# Patient Record
Sex: Male | Born: 2000 | Race: White | Hispanic: No
Health system: Southern US, Academic
[De-identification: ages and names within clinical notes are randomized; demographics above are authoritative.]

## PROBLEM LIST (undated history)

## (undated) ENCOUNTER — Ambulatory Visit: Payer: MEDICAID | Attending: Pediatrics | Primary: Pediatrics

## (undated) ENCOUNTER — Encounter

## (undated) ENCOUNTER — Telehealth: Attending: Pediatric Gastroenterology | Primary: Pediatric Gastroenterology

## (undated) ENCOUNTER — Telehealth

## (undated) ENCOUNTER — Ambulatory Visit

## (undated) ENCOUNTER — Ambulatory Visit: Payer: PRIVATE HEALTH INSURANCE

## (undated) ENCOUNTER — Ambulatory Visit: Payer: MEDICAID

## (undated) ENCOUNTER — Encounter: Attending: Pediatrics | Primary: Pediatrics

## (undated) ENCOUNTER — Telehealth: Payer: MEDICAID

## (undated) ENCOUNTER — Telehealth
Attending: Student in an Organized Health Care Education/Training Program | Primary: Student in an Organized Health Care Education/Training Program

## (undated) ENCOUNTER — Encounter: Attending: Family | Primary: Family

## (undated) ENCOUNTER — Ambulatory Visit: Payer: MEDICAID | Attending: Registered" | Primary: Registered"

## (undated) ENCOUNTER — Ambulatory Visit: Payer: MEDICAID | Attending: Clinical | Primary: Clinical

## (undated) ENCOUNTER — Ambulatory Visit: Payer: PRIVATE HEALTH INSURANCE | Attending: Family | Primary: Family

## (undated) ENCOUNTER — Ambulatory Visit: Payer: Medicaid (Managed Care)

## (undated) ENCOUNTER — Encounter: Attending: Podiatrist | Primary: Podiatrist

## (undated) ENCOUNTER — Telehealth: Attending: Pediatrics | Primary: Pediatrics

## (undated) ENCOUNTER — Ambulatory Visit
Attending: Student in an Organized Health Care Education/Training Program | Primary: Student in an Organized Health Care Education/Training Program

## (undated) ENCOUNTER — Ambulatory Visit
Payer: PRIVATE HEALTH INSURANCE | Attending: Student in an Organized Health Care Education/Training Program | Primary: Student in an Organized Health Care Education/Training Program

## (undated) ENCOUNTER — Inpatient Hospital Stay

## (undated) ENCOUNTER — Encounter
Attending: Student in an Organized Health Care Education/Training Program | Primary: Student in an Organized Health Care Education/Training Program

## (undated) ENCOUNTER — Telehealth: Attending: Clinical | Primary: Clinical

## (undated) ENCOUNTER — Other Ambulatory Visit

## (undated) ENCOUNTER — Encounter: Attending: Registered" | Primary: Registered"

## (undated) ENCOUNTER — Ambulatory Visit: Payer: Medicaid (Managed Care) | Attending: Podiatrist | Primary: Podiatrist

## (undated) ENCOUNTER — Ambulatory Visit: Payer: MEDICAID | Attending: Family | Primary: Family

## (undated) ENCOUNTER — Ambulatory Visit: Payer: PRIVATE HEALTH INSURANCE | Attending: Clinical | Primary: Clinical

## (undated) DIAGNOSIS — F32A Depression, unspecified: Secondary | ICD-10-CM

## (undated) DIAGNOSIS — F329 Major depressive disorder, single episode, unspecified: Secondary | ICD-10-CM

## (undated) DIAGNOSIS — S060X9A Concussion with loss of consciousness of unspecified duration, initial encounter: Secondary | ICD-10-CM

## (undated) DIAGNOSIS — F909 Attention-deficit hyperactivity disorder, unspecified type: Secondary | ICD-10-CM

## (undated) DIAGNOSIS — K5090 Crohn's disease, unspecified, without complications: Secondary | ICD-10-CM

## (undated) DIAGNOSIS — S060XAA Concussion with loss of consciousness status unknown, initial encounter: Secondary | ICD-10-CM

## (undated) HISTORY — PX: NO PAST SURGERIES: SHX2092

## (undated) HISTORY — PX: APPENDECTOMY: SHX54

## (undated) HISTORY — PX: CHOLECYSTECTOMY: SHX55

---
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
## 2022-01-19 ENCOUNTER — Ambulatory Visit
Admission: EM | Admit: 2022-01-19 | Discharge: 2022-01-19 | Disposition: A | Discharge status: Home / Self Care | Payer: Medicaid Other | Attending: Emergency Medicine | Admitting: Emergency Medicine

## 2022-01-19 DIAGNOSIS — M79645 Pain in left finger(s): Secondary | ICD-10-CM | POA: Diagnosis not present

## 2022-01-19 MED ORDER — PREDNISONE 20 MG PO TABS: 40.0000 mg | ORAL_TABLET | Freq: Every day | ORAL | 0 refills | Status: AC

## 2022-01-19 NOTE — Discharge Instructions (Addendum)
Your pain is most likely result of a tendon injury which we cannot see on x-ray and if you are pain persist you will need higher level imaging  Take prednisone every morning with food for 5 days, this medicine helps to reduce inflammation that occurs with injury and ideally will relieve you of your discomfort, may take Tylenol while using medicine  A wrist brace with thumb has been applied by nursing staff, you may wear as needed to add stability and support  You may use ice over the affected area or heat in 10 to 15-minute intervals  If your symptoms continue to persist she will need follow-up with orthopedics which is the specialist, information is listed on front page

## 2022-01-19 NOTE — ED Triage Notes (Signed)
Pt c/o injury to left thumb x7months ago.  Pt states that he was trying to catch a basketball and it hit his thumb and caused a popping noise. Pt now has pain when grabbing with his left hand or holding something heavy.   Pt states that he feels like he lost grip in his thumb due to the pain.   Pt states the pain shoots up his arm along the side. Pt denies any pain when moving his arm, other fingers, or wrist.   Pt states that there was bruising and swelling when the injury happened.

## 2022-01-19 NOTE — ED Provider Notes (Signed)
[facility]    CSN: [id number] Arrival date & time: 01/19/22  1457      History   Chief Complaint Chief Complaint  Patient presents with   Finger Injury    HPI Marcus Houston is a 21 y.o. male.   Patient presents with left thumb for 1.5 -2 months.  Symptoms began while playing basketball, went to catch the ball and unsure what way finger turned but he heard a popping sound.  Since initial injury he has had intermittent pain, pain radiates down into the wrist, symptoms are worsened when gripping objects causing a sensation of weakness.  Initially there was bruising and swelling which has subsided.  Has not attempted treatment of symptoms.  Denies numbness or tingling.  Past Medical History:  Diagnosis Date   ADHD    Concussion    Crohn's disease ([place])    Depression     There are no problems to display for this patient.   Past Surgical History:  Procedure Laterality Date   APPENDECTOMY     CHOLECYSTECTOMY     NO PAST SURGERIES         Home Medications    Prior to Admission medications   Medication Sig Start Date End Date Taking? Authorizing Provider  dicyclomine (BENTYL) 20 MG tablet Take 1 tablet (20 mg total) by mouth 2 (two) times daily. 06/22/21  Yes Margarette Canada, NP  EPINEPHrine 0.3 mg/0.3 mL IJ SOAJ injection Inject into the muscle. 02/11/19  Yes [provider]  ondansetron (ZOFRAN-ODT) 8 MG disintegrating tablet Take 1 tablet (8 mg total) by mouth every 8 (eight) hours as needed for nausea or vomiting. 06/22/21  Yes Margarette Canada, NP    Family History Family History  Problem Relation Age of Onset   Lupus Mother    Crohn's disease Father     Social History Social History   Tobacco Use   Smoking status: Never    Passive exposure: Yes   Smokeless tobacco: Never  Vaping Use   Vaping Use: Never used  Substance Use Topics   Alcohol use: No   Drug use: No     Allergies   Shrimp (diagnostic)   Review of Systems Review of  Systems Defer to HPI    Physical Exam Triage Vital Signs ED Triage Vitals  Enc Vitals Group     BP 01/19/22 1513 124/77     Pulse Rate 01/19/22 1513 62     Resp 01/19/22 1513 18     Temp 01/19/22 1513 98.4 F (36.9 C)     Temp Source 01/19/22 1513 Oral     SpO2 01/19/22 1513 100 %     Weight 01/19/22 1512 190 lb 3.2 oz (86.3 kg)     Height 01/19/22 1512 5\' 9"  (1.753 m)     Head Circumference --      Peak Flow --      Pain Score 01/19/22 1512 10     Pain Loc --      Pain Edu? --      Excl. in [place]? --    No data found.  Updated Vital Signs BP 124/77 (BP Location: Left Arm)   Pulse 62   Temp 98.4 F (36.9 C) (Oral)   Resp 18   Ht 5\' 9"  (1.753 m)   Wt 190 lb 3.2 oz (86.3 kg)   SpO2 100%   BMI 28.09 kg/m   Visual Acuity Right Eye Distance:   Left Eye Distance:   Bilateral  Distance:    Right Eye Near:   Left Eye Near:    Bilateral Near:     Physical Exam Constitutional:      Appearance: Normal appearance.  Eyes:     Extraocular Movements: Extraocular movements intact.  Pulmonary:     Effort: Pulmonary effort is normal.  Musculoskeletal:     Comments: Tenderness is present along the lateral aspect of the left thumb, no direct tenderness over the scaphoid bone, range of motion of the fingers intact, 2+ radial pulse, sensation intact, capillary refill less than 3  Skin:    General: Skin is warm and dry.  Neurological:     Mental Status: He is alert and oriented to person, place, and time. Mental status is at baseline.  Psychiatric:        Mood and Affect: Mood normal.        Behavior: Behavior normal.      UC Treatments / Results  Labs (all labs ordered are listed, but only abnormal results are displayed) Labs Reviewed - No data to display  EKG   Radiology No results found.  Procedures Procedures (including critical care time)  Medications Ordered in UC Medications - No data to display  Initial Impression / Assessment and Plan / UC Course  I  have reviewed the triage vital signs and the nursing notes.  Pertinent labs & imaging results that were available during my care of the patient were reviewed by me and considered in my medical decision making (see chart for details).  Left thumb pain  Etiology is most likely injury to the tendon or ligament, discussed with patient, will defer imaging, prescribed prednisone and placed in a left wrist brace with thumb spica, to be used as needed, recommended rest, ice or heat and activity as tolerated, if symptoms continue to persist he has been given information to orthopedics for further evaluation Final Clinical Impressions(s) / UC Diagnoses   Final diagnoses:  None   Discharge Instructions   None    ED Prescriptions   None    PDMP not reviewed this encounter.   Valinda Hoar, NP 01/19/22 1540

## 2022-01-29 ENCOUNTER — Institutional Professional Consult (permissible substitution): Admit: 2022-01-29 | Discharge: 2022-01-30 | Payer: PRIVATE HEALTH INSURANCE

## 2022-01-29 DIAGNOSIS — Z006 Encounter for examination for normal comparison and control in clinical research program: Principal | ICD-10-CM

## 2022-03-09 MED ORDER — ONDANSETRON 4 MG DISINTEGRATING TABLET
ORAL_TABLET | Freq: Three times a day (TID) | ORAL | 0 refills | 10 days | Status: CP | PRN
Start: 2022-03-09 — End: ?

## 2022-03-09 MED ORDER — BUTALBITAL-ACETAMINOPHEN-CAFFEINE 50 MG-325 MG-40 MG TABLET
ORAL_TABLET | ORAL | 0 refills | 1 days | Status: CP | PRN
Start: 2022-03-09 — End: ?

## 2022-04-30 ENCOUNTER — Institutional Professional Consult (permissible substitution): Admit: 2022-04-30 | Discharge: 2022-05-01 | Payer: PRIVATE HEALTH INSURANCE

## 2022-04-30 DIAGNOSIS — Z006 Encounter for examination for normal comparison and control in clinical research program: Principal | ICD-10-CM

## 2022-06-11 ENCOUNTER — Institutional Professional Consult (permissible substitution): Admit: 2022-06-11 | Discharge: 2022-06-12 | Payer: PRIVATE HEALTH INSURANCE

## 2022-06-11 DIAGNOSIS — Z006 Encounter for examination for normal comparison and control in clinical research program: Principal | ICD-10-CM

## 2022-06-23 ENCOUNTER — Ambulatory Visit
Admit: 2022-06-23 | Discharge: 2022-06-24 | Disposition: A | Discharge status: Home / Self Care | Payer: PRIVATE HEALTH INSURANCE

## 2022-06-23 ENCOUNTER — Emergency Department
Admit: 2022-06-23 | Discharge: 2022-06-24 | Disposition: A | Discharge status: Home / Self Care | Payer: PRIVATE HEALTH INSURANCE

## 2022-06-23 DIAGNOSIS — S93432A Sprain of tibiofibular ligament of left ankle, initial encounter: Principal | ICD-10-CM

## 2022-06-26 ENCOUNTER — Institutional Professional Consult (permissible substitution): Admit: 2022-06-26 | Discharge: 2022-06-27 | Payer: PRIVATE HEALTH INSURANCE

## 2022-06-26 DIAGNOSIS — Z006 Encounter for examination for normal comparison and control in clinical research program: Principal | ICD-10-CM

## 2022-07-12 ENCOUNTER — Ambulatory Visit: Admit: 2022-07-12 | Discharge: 2022-07-12 | Payer: PRIVATE HEALTH INSURANCE

## 2022-07-12 ENCOUNTER — Encounter
Admit: 2022-07-12 | Discharge: 2022-07-12 | Payer: PRIVATE HEALTH INSURANCE | Attending: Anesthesiology | Primary: Anesthesiology

## 2022-07-12 ENCOUNTER — Institutional Professional Consult (permissible substitution): Admit: 2022-07-12 | Discharge: 2022-07-13 | Payer: PRIVATE HEALTH INSURANCE

## 2022-07-12 DIAGNOSIS — Z006 Encounter for examination for normal comparison and control in clinical research program: Principal | ICD-10-CM

## 2022-12-14 ENCOUNTER — Emergency Department
Admit: 2022-12-14 | Discharge: 2022-12-14 | Disposition: A | Discharge status: Home / Self Care | Payer: PRIVATE HEALTH INSURANCE

## 2022-12-14 ENCOUNTER — Ambulatory Visit
Admit: 2022-12-14 | Discharge: 2022-12-14 | Disposition: A | Discharge status: Home / Self Care | Payer: PRIVATE HEALTH INSURANCE

## 2023-05-09 ENCOUNTER — Emergency Department
Admit: 2023-05-09 | Discharge: 2023-05-09 | Disposition: A | Discharge status: Home / Self Care | Payer: PRIVATE HEALTH INSURANCE

## 2023-05-09 MED ORDER — METOCLOPRAMIDE 10 MG TABLET
ORAL_TABLET | Freq: Four times a day (QID) | ORAL | 0 refills | 10.00 days | Status: CP
Start: 2023-05-09 — End: 2023-05-19

## 2023-05-09 MED ORDER — ONDANSETRON 4 MG DISINTEGRATING TABLET
ORAL_TABLET | Freq: Three times a day (TID) | ORAL | 0 refills | 10.00 days | Status: CP | PRN
Start: 2023-05-09 — End: 2023-05-19

## 2023-05-09 MED ORDER — DIPHENHYDRAMINE 25 MG CAPSULE
ORAL_CAPSULE | Freq: Four times a day (QID) | ORAL | 0 refills | 5.00 days | Status: CP | PRN
Start: 2023-05-09 — End: ?

## 2023-05-09 MED ORDER — KETOROLAC 10 MG TABLET
ORAL_TABLET | Freq: Four times a day (QID) | ORAL | 0 refills | 5.00 days | Status: CP | PRN
Start: 2023-05-09 — End: 2023-05-14

## 2023-05-14 ENCOUNTER — Emergency Department
Admit: 2023-05-14 | Discharge: 2023-05-14 | Disposition: A | Discharge status: Home / Self Care | Payer: PRIVATE HEALTH INSURANCE | Attending: Emergency Medicine

## 2023-05-14 DIAGNOSIS — R519 Nonintractable headache, unspecified chronicity pattern, unspecified headache type: Principal | ICD-10-CM

## 2023-05-14 DIAGNOSIS — K029 Dental caries, unspecified: Principal | ICD-10-CM

## 2023-05-14 MED ORDER — AMOXICILLIN 875 MG-POTASSIUM CLAVULANATE 125 MG TABLET
ORAL_TABLET | Freq: Two times a day (BID) | ORAL | 0 refills | 7.00 days | Status: CP
Start: 2023-05-14 — End: 2023-05-21

## 2023-08-04 ENCOUNTER — Emergency Department
Admit: 2023-08-04 | Discharge: 2023-08-05 | Disposition: A | Discharge status: Home / Self Care | Payer: Medicaid (Managed Care)

## 2023-08-04 DIAGNOSIS — R109 Unspecified abdominal pain: Principal | ICD-10-CM

## 2023-08-04 DIAGNOSIS — N135 Crossing vessel and stricture of ureter without hydronephrosis: Principal | ICD-10-CM

## 2023-08-04 MED ORDER — TAMSULOSIN 0.4 MG CAPSULE
ORAL_CAPSULE | Freq: Every day | ORAL | 0 refills | 7.00 days | Status: CP
Start: 2023-08-04 — End: 2023-08-11

## 2023-08-04 MED ORDER — OXYCODONE 5 MG TABLET
ORAL_TABLET | ORAL | 0 refills | 2.00 days | Status: CP | PRN
Start: 2023-08-04 — End: 2023-08-09

## 2023-08-04 MED ORDER — IBUPROFEN 600 MG TABLET
ORAL_TABLET | Freq: Four times a day (QID) | ORAL | 0 refills | 8.00 days | Status: CP | PRN
Start: 2023-08-04 — End: ?

## 2023-08-05 ENCOUNTER — Emergency Department
Admission: EM | Admit: 2023-08-05 | Discharge: 2023-08-06 | Disposition: A | Discharge status: Home / Self Care | Attending: Emergency Medicine | Admitting: Emergency Medicine

## 2023-08-05 DIAGNOSIS — R000 Tachycardia, unspecified: Secondary | ICD-10-CM | POA: Insufficient documentation

## 2023-08-05 DIAGNOSIS — N23 Unspecified renal colic: Secondary | ICD-10-CM | POA: Diagnosis present

## 2023-08-05 MED ORDER — KETOROLAC TROMETHAMINE 30 MG/ML IJ SOLN
15.0000 mg | Freq: Once | INTRAMUSCULAR | Status: AC
  Administered 2023-08-06: 15 mg via INTRAVENOUS
  Filled 2023-08-05: qty 1

## 2023-08-05 MED ORDER — ONDANSETRON HCL 4 MG/2ML IJ SOLN
4.0000 mg | Freq: Once | INTRAMUSCULAR | Status: AC
  Administered 2023-08-06: 4 mg via INTRAVENOUS
  Filled 2023-08-05: qty 2

## 2023-08-05 MED ORDER — SODIUM CHLORIDE 0.9 % IV BOLUS
1000.0000 mL | Freq: Once | INTRAVENOUS | Status: AC
  Administered 2023-08-06: 1000 mL via INTRAVENOUS

## 2023-08-05 NOTE — ED Provider Notes (Signed)
 [facility] Provider Note    Event Date/Time   First MD Initiated Contact with Patient 08/05/23 2351     (approximate)   History   Kidney stone pain   HPI  Marcus Houston is a 23 y.o. male who accompanied his significant other who is a patient in the emergency department when he began to have intractable pain from passing a kidney stone.  Patient was seen at [facility] yesterday and found to have a 2 mm right ureteral stone.  Endorses nausea.  Denies fever/chills, chest pain, shortness of breath, abdominal pain, vomiting or hematuria.     Past Medical History   Past Medical History:  Diagnosis Date   ADHD    Concussion    Crohn's disease [facility])    Depression      Active Problem List  There are no active problems to display for this patient.    Past Surgical History   Past Surgical History:  Procedure Laterality Date   APPENDECTOMY     CHOLECYSTECTOMY     NO PAST SURGERIES       Home Medications   Prior to Admission medications   Medication Sig Start Date End Date Taking? Authorizing Provider  ondansetron (ZOFRAN-ODT) 4 MG disintegrating tablet Take 1 tablet (4 mg total) by mouth every 8 (eight) hours as needed for nausea or vomiting. 08/06/23  Yes Irean Hong, MD  dicyclomine (BENTYL) 20 MG tablet Take 1 tablet (20 mg total) by mouth 2 (two) times daily. 06/22/21   Becky Augusta, NP  EPINEPHrine 0.3 mg/0.3 mL IJ SOAJ injection Inject into the muscle. 02/11/19   [provider]  predniSONE (DELTASONE) 20 MG tablet Take 2 tablets (40 mg total) by mouth daily. 01/19/22   Valinda Hoar, NP     Allergies  Shrimp (diagnostic)   Family History   Family History  Problem Relation Age of Onset   Lupus Mother    Crohn's disease Father      Physical Exam  Triage Vital Signs: ED Triage Vitals  Encounter Vitals Group     BP      Systolic BP Percentile      Diastolic BP Percentile      Pulse      Resp      Temp      Temp  src      SpO2      Weight      Height      Head Circumference      Peak Flow      Pain Score      Pain Loc      Pain Education      Exclude from Growth Chart     Updated Vital Signs: BP 123/69 (BP Location: Right Arm)   Pulse (!) 106   Temp 98.4 F (36.9 C) (Oral)   Resp 19   Ht 5\' 10"  (1.778 m)   Wt 97.5 kg   SpO2 97%   BMI 30.85 kg/m    General: Awake, mild to moderate distress.  CV:  Mildly tachycardic.  Good peripheral perfusion.  Resp:  Normal effort.  CTAB. Abd:  Nontender.  Mild right CVAT.  No distention.  Other:  No truncal vesicles.   ED Results / Procedures / Treatments  Labs (all labs ordered are listed, but only abnormal results are displayed) Labs Reviewed - No data to display   EKG  None   RADIOLOGY None   Official radiology report(s):  No results found.   PROCEDURES:  Critical Care performed: No  Procedures   MEDICATIONS ORDERED IN ED: Medications  sodium chloride 0.9 % bolus 1,000 mL (0 mLs Intravenous Stopped 08/06/23 0129)  ondansetron (ZOFRAN) injection 4 mg (4 mg Intravenous Given 08/06/23 0009)  ketorolac (TORADOL) 30 MG/ML injection 15 mg (15 mg Intravenous Given 08/06/23 0009)     IMPRESSION / MDM / ASSESSMENT AND PLAN / ED COURSE  I reviewed the triage vital signs and the nursing notes.                             23 year old male presenting with kidney stone pain. Differential diagnosis includes, but is not limited to, acute appendicitis, renal colic, testicular torsion, urinary tract infection/pyelonephritis, prostatitis,  epididymitis, diverticulitis, small bowel obstruction or ileus, colitis, abdominal aortic aneurysm, gastroenteritis, hernia, etc. I have personally reviewed patient's [facility] records from yesterday and note his unremarkable CBC, met B, UA negative for infection and CT demonstrating 2 mm right ureteral stone.  Patient's presentation is most consistent with acute complicated illness / injury requiring  diagnostic workup.  Do not feel patient needs repeat imaging or lab work/urine.  Administer IV fluids, ketorolac, Zofran and reassess.  Clinical Course as of 08/06/23 0611  Tue Aug 06, 2023  0138 Patient feeling significantly better.  Has prescriptions for Oxycodone and Flomax given at [facility] yesterday.  Will add Zofran to use as needed.  Strict return precautions given.  Patient verbalizes understanding and agrees with plan of care. [JS]    Clinical Course User Index [JS] Norlene Beavers, MD     FINAL CLINICAL IMPRESSION(S) / ED DIAGNOSES   Final diagnoses:  Ureteral colic     Rx / DC Orders   ED Discharge Orders          Ordered    ondansetron (ZOFRAN-ODT) 4 MG disintegrating tablet  Every 8 hours PRN        08/06/23 0139             Note:  This document was prepared using Dragon voice recognition software and may include unintentional dictation errors.   Amarys Sliwinski J, MD 08/06/23 [phone]

## 2023-08-05 NOTE — ED Triage Notes (Signed)
 Patient to ED via POV from home. Patient states 2 days ago he began having R flank pain. Patient was seen yesterday at [facility] where he was diagnosed with a kidney stone. Patient was in treatment room with his significant other tonight when he began having increased pain in the R flank and nausea. Patient denies Hx of kidney stone.

## 2023-08-06 ENCOUNTER — Other Ambulatory Visit: Payer: Self-pay

## 2023-08-06 MED ORDER — ONDANSETRON 4 MG PO TBDP: 4.0000 mg | ORAL_TABLET | Freq: Three times a day (TID) | ORAL | 0 refills | Status: AC | PRN

## 2023-08-06 NOTE — Discharge Instructions (Signed)
 You may take the pain medicine and Flomax you already have.  You may take Zofran as needed for nausea/vomiting.  Drink plenty of filtered or bottled water daily.  Return to the ER for worsening symptoms, persistent vomiting, fever or other concerns.

## 2023-08-27 ENCOUNTER — Emergency Department
Admit: 2023-08-27 | Discharge: 2023-08-27 | Disposition: A | Discharge status: Home / Self Care | Payer: Medicaid (Managed Care)

## 2023-08-27 DIAGNOSIS — L600 Ingrowing nail: Principal | ICD-10-CM

## 2023-08-27 MED ORDER — DOXYCYCLINE HYCLATE 100 MG CAPSULE
ORAL_CAPSULE | Freq: Two times a day (BID) | ORAL | 0 refills | 10.00000 days | Status: CP
Start: 2023-08-27 — End: 2023-09-06

## 2023-09-06 ENCOUNTER — Encounter
Admit: 2023-09-06 | Discharge: 2023-09-09 | Disposition: A | Discharge status: Home / Self Care | Payer: Medicaid (Managed Care)

## 2023-09-06 ENCOUNTER — Inpatient Hospital Stay
Admit: 2023-09-06 | Discharge: 2023-09-09 | Disposition: A | Discharge status: Home / Self Care | Payer: Medicaid (Managed Care)

## 2023-09-06 ENCOUNTER — Ambulatory Visit
Admit: 2023-09-06 | Discharge: 2023-09-09 | Disposition: A | Discharge status: Home / Self Care | Payer: Medicaid (Managed Care)

## 2023-09-06 ENCOUNTER — Encounter: Admit: 2023-09-06 | Disposition: A | Discharge status: Home / Self Care | Payer: Medicaid (Managed Care)

## 2023-09-09 DIAGNOSIS — N398 Other specified disorders of urinary system: Principal | ICD-10-CM

## 2023-09-09 MED ORDER — TAMSULOSIN 0.4 MG CAPSULE
ORAL_CAPSULE | Freq: Every day | ORAL | 0 refills | 14.00000 days | Status: CP
Start: 2023-09-09 — End: 2023-09-23
  Filled 2023-09-09: qty 14, 14d supply, fill #0

## 2023-09-09 MED ORDER — OXYBUTYNIN CHLORIDE 5 MG TABLET
ORAL_TABLET | Freq: Three times a day (TID) | ORAL | 0 refills | 6.00000 days | Status: CP | PRN
Start: 2023-09-09 — End: 2023-09-15
  Filled 2023-09-09: qty 18, 6d supply, fill #0

## 2023-09-09 MED ORDER — KETOROLAC 10 MG TABLET
ORAL_TABLET | Freq: Four times a day (QID) | ORAL | 0 refills | 5.00000 days | Status: CP | PRN
Start: 2023-09-09 — End: 2023-09-14
  Filled 2023-09-09: qty 20, 5d supply, fill #0

## 2023-09-12 DIAGNOSIS — N201 Calculus of ureter: Principal | ICD-10-CM

## 2023-09-13 ENCOUNTER — Ambulatory Visit: Admit: 2023-09-13 | Discharge: 2023-09-14 | Payer: Medicaid (Managed Care)

## 2023-09-13 ENCOUNTER — Inpatient Hospital Stay: Admit: 2023-09-13 | Discharge: 2023-09-14 | Payer: Medicaid (Managed Care)

## 2023-09-14 MED ORDER — PHENAZOPYRIDINE 200 MG TABLET
ORAL_TABLET | Freq: Three times a day (TID) | ORAL | 0 refills | 2.00000 days | Status: CP
Start: 2023-09-14 — End: 2023-09-16

## 2023-09-14 MED ORDER — OXYCODONE 5 MG TABLET
ORAL_TABLET | ORAL | 0 refills | 3.00000 days | Status: CP | PRN
Start: 2023-09-14 — End: 2023-09-19

## 2023-09-14 MED ORDER — OXYBUTYNIN CHLORIDE 5 MG TABLET
ORAL_TABLET | Freq: Three times a day (TID) | ORAL | 0 refills | 6.00000 days | Status: CP | PRN
Start: 2023-09-14 — End: 2023-09-20

## 2023-09-26 ENCOUNTER — Emergency Department
Admit: 2023-09-26 | Discharge: 2023-09-26 | Disposition: A | Discharge status: Home / Self Care | Payer: Medicaid (Managed Care)

## 2023-09-26 MED ORDER — LIDOCAINE 4 % TOPICAL PATCH
MEDICATED_PATCH | Freq: Every day | TRANSDERMAL | 0 refills | 30.00000 days | Status: CP
Start: 2023-09-26 — End: 2023-10-26

## 2023-09-26 MED ORDER — OXYCODONE 5 MG TABLET
ORAL_TABLET | ORAL | 0 refills | 2.00000 days | Status: CP | PRN
Start: 2023-09-26 — End: 2023-10-01

## 2023-09-30 ENCOUNTER — Emergency Department
Admit: 2023-09-30 | Discharge: 2023-10-01 | Disposition: A | Discharge status: Home / Self Care | Payer: Medicaid (Managed Care)

## 2023-09-30 DIAGNOSIS — N200 Calculus of kidney: Principal | ICD-10-CM

## 2023-09-30 MED ORDER — ONDANSETRON 4 MG DISINTEGRATING TABLET
ORAL_TABLET | Freq: Three times a day (TID) | 0 refills | 7.00000 days | Status: CP | PRN
Start: 2023-09-30 — End: 2023-10-07

## 2023-09-30 MED ORDER — KETOROLAC 10 MG TABLET
ORAL_TABLET | Freq: Four times a day (QID) | ORAL | 0 refills | 5.00000 days | Status: CP | PRN
Start: 2023-09-30 — End: 2023-10-05

## 2023-10-10 ENCOUNTER — Encounter: Admit: 2023-10-10 | Discharge: 2023-10-10 | Payer: Medicaid (Managed Care)

## 2023-10-10 ENCOUNTER — Inpatient Hospital Stay: Admit: 2023-10-10 | Discharge: 2023-10-10 | Payer: Medicaid (Managed Care)

## 2023-10-10 ENCOUNTER — Ambulatory Visit: Admit: 2023-10-10 | Discharge: 2023-10-10 | Payer: Medicaid (Managed Care)

## 2023-10-10 MED ORDER — OXYCODONE 5 MG TABLET
ORAL_TABLET | ORAL | 0 refills | 2.00000 days | Status: CP | PRN
Start: 2023-10-10 — End: 2023-10-15
  Filled 2023-10-10: qty 10, 2d supply, fill #0

## 2023-10-10 MED ORDER — TAMSULOSIN 0.4 MG CAPSULE
ORAL_CAPSULE | Freq: Every day | ORAL | 0 refills | 90.00000 days | Status: CP
Start: 2023-10-10 — End: 2024-01-08
  Filled 2023-10-10: qty 90, 90d supply, fill #0

## 2023-10-10 MED ORDER — PHENAZOPYRIDINE 200 MG TABLET
ORAL_TABLET | Freq: Three times a day (TID) | ORAL | 0 refills | 2.00000 days | Status: CP
Start: 2023-10-10 — End: 2023-10-12
  Filled 2023-10-10: qty 6, 2d supply, fill #0

## 2023-10-10 MED ORDER — OXYBUTYNIN CHLORIDE 5 MG TABLET
ORAL_TABLET | Freq: Three times a day (TID) | ORAL | 0 refills | 6.00000 days | Status: CN | PRN
Start: 2023-10-10 — End: 2023-10-16

## 2023-10-31 ENCOUNTER — Ambulatory Visit: Admit: 2023-10-31 | Payer: Medicaid (Managed Care) | Attending: Adult Health | Primary: Adult Health

## 2023-10-31 ENCOUNTER — Ambulatory Visit: Admit: 2023-10-31 | Payer: Medicaid (Managed Care)

## 2024-01-13 ENCOUNTER — Ambulatory Visit: Admit: 2024-01-13 | Discharge: 2024-01-13 | Payer: Medicaid (Managed Care) | Attending: Podiatrist | Primary: Podiatrist

## 2024-01-13 ENCOUNTER — Inpatient Hospital Stay: Admit: 2024-01-13 | Discharge: 2024-01-13 | Payer: Medicaid (Managed Care)

## 2024-01-13 DIAGNOSIS — Z8719 Personal history of other diseases of the digestive system: Principal | ICD-10-CM

## 2024-01-13 DIAGNOSIS — L03032 Cellulitis of left toe: Principal | ICD-10-CM

## 2024-01-13 DIAGNOSIS — M79671 Pain in right foot: Principal | ICD-10-CM

## 2024-01-13 DIAGNOSIS — M79672 Pain in left foot: Principal | ICD-10-CM

## 2024-01-13 MED ORDER — CEPHALEXIN 500 MG CAPSULE
ORAL_CAPSULE | Freq: Four times a day (QID) | ORAL | 0 refills | 10.00000 days | Status: CP
Start: 2024-01-13 — End: 2024-01-23

## 2024-01-28 ENCOUNTER — Ambulatory Visit: Admit: 2024-01-28 | Discharge: 2024-01-29 | Payer: Medicaid (Managed Care) | Attending: Podiatrist | Primary: Podiatrist

## 2024-01-28 DIAGNOSIS — L600 Ingrowing nail: Principal | ICD-10-CM

## 2024-01-28 DIAGNOSIS — M7661 Achilles tendinitis, right leg: Principal | ICD-10-CM

## 2024-01-28 DIAGNOSIS — M7662 Achilles tendinitis, left leg: Principal | ICD-10-CM

## 2024-01-28 MED ORDER — CEPHALEXIN 500 MG CAPSULE
ORAL_CAPSULE | Freq: Four times a day (QID) | ORAL | 0 refills | 2.00000 days | Status: CP
Start: 2024-01-28 — End: 2024-02-04

## 2024-02-10 ENCOUNTER — Ambulatory Visit: Admit: 2024-02-10 | Discharge: 2024-02-11 | Payer: Medicaid (Managed Care) | Attending: Podiatrist | Primary: Podiatrist

## 2024-02-10 DIAGNOSIS — D229 Melanocytic nevi, unspecified: Principal | ICD-10-CM

## 2024-02-10 DIAGNOSIS — M6788 Other specified disorders of synovium and tendon, other site: Principal | ICD-10-CM

## 2024-02-10 MED ORDER — MUPIROCIN 2 % TOPICAL OINTMENT
Freq: Every day | TOPICAL | 0 refills | 7.00000 days | Status: CP | PRN
Start: 2024-02-10 — End: 2024-02-17

## 2024-02-17 DIAGNOSIS — D229 Melanocytic nevi, unspecified: Principal | ICD-10-CM

## 2024-04-06 ENCOUNTER — Ambulatory Visit: Admit: 2024-04-06 | Attending: Podiatrist | Primary: Podiatrist
